# Patient Record
Sex: Female | Born: 1970 | Race: Black or African American | Hispanic: No | Marital: Married
Health system: Southern US, Community
[De-identification: ages and names within clinical notes are randomized; demographics above are authoritative.]

## PROBLEM LIST (undated history)

## (undated) DIAGNOSIS — F419 Anxiety disorder, unspecified: Secondary | ICD-10-CM

## (undated) DIAGNOSIS — I10 Essential (primary) hypertension: Secondary | ICD-10-CM

## (undated) DIAGNOSIS — K824 Cholesterolosis of gallbladder: Secondary | ICD-10-CM

---

## 2019-06-11 ENCOUNTER — Emergency Department (HOSPITAL_COMMUNITY): Payer: No Typology Code available for payment source

## 2019-06-11 ENCOUNTER — Emergency Department (HOSPITAL_COMMUNITY)
Admission: EM | Admit: 2019-06-11 | Discharge: 2019-06-11 | Disposition: A | Discharge status: Home / Self Care | Payer: No Typology Code available for payment source | Attending: Emergency Medicine | Admitting: Emergency Medicine

## 2019-06-11 ENCOUNTER — Encounter (HOSPITAL_COMMUNITY): Payer: Self-pay

## 2019-06-11 ENCOUNTER — Other Ambulatory Visit: Payer: Self-pay

## 2019-06-11 DIAGNOSIS — Y92410 Unspecified street and highway as the place of occurrence of the external cause: Secondary | ICD-10-CM | POA: Diagnosis not present

## 2019-06-11 DIAGNOSIS — Y9389 Activity, other specified: Secondary | ICD-10-CM | POA: Diagnosis not present

## 2019-06-11 DIAGNOSIS — Z885 Allergy status to narcotic agent status: Secondary | ICD-10-CM | POA: Diagnosis not present

## 2019-06-11 DIAGNOSIS — S99922A Unspecified injury of left foot, initial encounter: Secondary | ICD-10-CM | POA: Diagnosis present

## 2019-06-11 DIAGNOSIS — Z888 Allergy status to other drugs, medicaments and biological substances status: Secondary | ICD-10-CM | POA: Diagnosis not present

## 2019-06-11 DIAGNOSIS — Y999 Unspecified external cause status: Secondary | ICD-10-CM | POA: Diagnosis not present

## 2019-06-11 DIAGNOSIS — I10 Essential (primary) hypertension: Secondary | ICD-10-CM | POA: Insufficient documentation

## 2019-06-11 DIAGNOSIS — Z880 Allergy status to penicillin: Secondary | ICD-10-CM | POA: Diagnosis not present

## 2019-06-11 DIAGNOSIS — S81012A Laceration without foreign body, left knee, initial encounter: Secondary | ICD-10-CM | POA: Insufficient documentation

## 2019-06-11 DIAGNOSIS — S92315A Nondisplaced fracture of first metatarsal bone, left foot, initial encounter for closed fracture: Secondary | ICD-10-CM | POA: Diagnosis not present

## 2019-06-11 DIAGNOSIS — S92514A Nondisplaced fracture of proximal phalanx of right lesser toe(s), initial encounter for closed fracture: Secondary | ICD-10-CM | POA: Diagnosis not present

## 2019-06-11 DIAGNOSIS — R51 Headache: Secondary | ICD-10-CM | POA: Insufficient documentation

## 2019-06-11 HISTORY — DX: Anxiety disorder, unspecified: F41.9

## 2019-06-11 HISTORY — DX: Cholesterolosis of gallbladder: K82.4

## 2019-06-11 HISTORY — DX: Essential (primary) hypertension: I10

## 2019-06-11 MED ORDER — OXYCODONE HCL 15 MG PO TABS: 15.0000 mg | ORAL_TABLET | Freq: Four times a day (QID) | ORAL | 0 refills | Status: AC | PRN

## 2019-06-11 MED ORDER — LIDOCAINE-EPINEPHRINE (PF) 2 %-1:200000 IJ SOLN
10.0000 mL | Freq: Once | INTRAMUSCULAR | Status: AC
  Administered 2019-06-11: 10 mL
  Filled 2019-06-11: qty 10

## 2019-06-11 NOTE — ED Notes (Signed)
Ortho tech called 
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
